# Patient Record
Sex: Male | Born: 1980 | Race: White | Hispanic: No | Marital: Single | Smoking: Current every day smoker
Health system: Southern US, Community
[De-identification: ages and names within clinical notes are randomized; demographics above are authoritative.]

---

## 2011-06-30 ENCOUNTER — Emergency Department (INDEPENDENT_AMBULATORY_CARE_PROVIDER_SITE_OTHER)
Admission: EM | Admit: 2011-06-30 | Discharge: 2011-06-30 | Disposition: A | Discharge status: Home / Self Care | Payer: 59 | Source: Home / Self Care | Attending: Family Medicine | Admitting: Family Medicine

## 2011-06-30 ENCOUNTER — Encounter: Payer: Self-pay | Admitting: *Deleted

## 2011-06-30 DIAGNOSIS — J069 Acute upper respiratory infection, unspecified: Secondary | ICD-10-CM

## 2011-06-30 DIAGNOSIS — H6592 Unspecified nonsuppurative otitis media, left ear: Secondary | ICD-10-CM

## 2011-06-30 DIAGNOSIS — H6590 Unspecified nonsuppurative otitis media, unspecified ear: Secondary | ICD-10-CM

## 2011-06-30 MED ORDER — BENZONATATE 200 MG PO CAPS: 200.0000 mg | ORAL_CAPSULE | Freq: Every day | ORAL | Status: AC

## 2011-06-30 MED ORDER — AZITHROMYCIN 250 MG PO TABS: ORAL_TABLET | ORAL | Status: AC

## 2011-06-30 NOTE — Discharge Instructions (Signed)
Take Mucinex D (guaifenesin with decongestant) twice daily for congestion.  Increase fluid intake, rest. May use Afrin nasal spray (or generic oxymetazoline) twice daily for about 5 days.  Also recommend using saline nasal spray several times daily and saline nasal irrigation (AYR is a common brand) Stop all antihistamines for now, and other non-prescription cough/cold preparations. Recommend a Tdap when well.  Follow-up with family doctor if not improving 7 to 10 days.  

## 2011-06-30 NOTE — ED Notes (Signed)
Pt c/o cough, possible fever, and HA x 3 days. He has taken nyquil and mucinex.

## 2011-06-30 NOTE — ED Provider Notes (Signed)
History     CSN: [id number]  Arrival date & time 06/30/11  1110   First MD Initiated Contact with Patient 06/30/11 1200      Chief Complaint  Patient presents with  . Fever  . Cough      HPI Comments: Patient complains of approximately 3 day history of gradually progressive URI symptoms beginning with fatigue and myalgias, followed by progressive nasal congestion.  There has been minimal sore throat.   A cough started yesterday.  Cough is now worse at night and generally non-productive during the day.  There has been no pleuritic pain, shortness of breath, or wheezes.   He smokes 1 pack per day.  He does not remember his last tetanus shot.  The history is provided by the patient.    History reviewed. No pertinent past medical history.  History reviewed. No pertinent past surgical history.  History reviewed. No pertinent family history.  History  Substance Use Topics  . Smoking status: Current Everyday Smoker -- 1.0 packs/day  . Smokeless tobacco: Not on file  . Alcohol Use: No      Review of Systems + mild sore throat with cough + cough No pleuritic pain No wheezing + nasal congestion + post-nasal drainage No sinus pain/pressure No itchy/red eyes ? Earache; ears feel somewhat clogged No hemoptysis No SOB No fever, + chills/sweats at night No nausea No vomiting No abdominal pain No diarrhea No urinary symptoms No skin rashes + fatigue + myalgias + headache Used OTC meds without relief (Nyquil) Allergies  Review of patient's allergies indicates no known allergies.  Home Medications   Current Outpatient Rx  Name Route Sig Dispense Refill  . AZITHROMYCIN 250 MG PO TABS  Take 2 tabs today; then begin one tab once daily for 4 more days. 6 each 0  . BENZONATATE 200 MG PO CAPS Oral Take 1 capsule (200 mg total) by mouth at bedtime. Take as needed for cough 12 capsule 0    BP 141/98  Pulse 73  Temp(Src) 98.8 F (37.1 C) (Oral)  Resp 16  Ht 6' (1.829 m)   Wt 200 lb 12 oz (91.06 kg)  BMI 27.23 kg/m2  SpO2 99%  Physical Exam Nursing notes and Vital Signs reviewed. Appearance:  Patient appears healthy, stated age, and in no acute distress Eyes:  Pupils are equal, round, and reactive to light and accomodation.  Extraocular movement is intact.  Conjunctivae are not inflamed  Ears:  Canals normal.  Right tympanic membrane appears to have some serous effusion.  Left tympanic membrane has serous effusion and developing mild erythema Nose:  Mildly congested turbinates, worse on the left.  No sinus tenderness.  Pharynx:  Normal Neck:  Supple.  No adenopathy Lungs:  Clear to auscultation.  Breath sounds are equal.  Heart:  Regular rate and rhythm without murmurs, rubs, or gallops.  Abdomen:  Nontender without masses or hepatosplenomegaly.  Bowel sounds are present.  No CVA or flank tenderness.  Extremities:  No edema.  No calf tenderness Skin:  No rash present.   ED Course  Procedures  none      1. Acute upper respiratory infections of unspecified site   2. Left serous otitis media       MDM  Begin a Z-pack.  Prescription written for Benzonatate [facility]) to take at bedtime for night-time cough.  Take Mucinex D (guaifenesin with decongestant) twice daily for congestion.  Increase fluid intake, rest. May use Afrin nasal spray (or generic oxymetazoline)  twice daily for about 5 days.  Also recommend using saline nasal spray several times daily and saline nasal irrigation (AYR is a common brand) Stop all antihistamines for now, and other non-prescription cough/cold preparations. Recommend a Tdap when well.  Follow-up with family doctor if not improving 7 to 10 days.         Lattie Haw, MD 06/30/11 1225

## 2013-08-16 ENCOUNTER — Emergency Department (HOSPITAL_COMMUNITY): Payer: 59

## 2013-08-16 ENCOUNTER — Inpatient Hospital Stay (HOSPITAL_COMMUNITY)
Admission: EM | Admit: 2013-08-16 | Discharge: 2013-08-20 | DRG: 917 | Disposition: A | Discharge status: Home / Self Care | Payer: 59 | Attending: Internal Medicine | Admitting: Internal Medicine

## 2013-08-16 ENCOUNTER — Encounter (HOSPITAL_COMMUNITY): Payer: Self-pay | Admitting: Emergency Medicine

## 2013-08-16 DIAGNOSIS — J9600 Acute respiratory failure, unspecified whether with hypoxia or hypercapnia: Secondary | ICD-10-CM | POA: Diagnosis present

## 2013-08-16 DIAGNOSIS — R911 Solitary pulmonary nodule: Secondary | ICD-10-CM | POA: Diagnosis present

## 2013-08-16 DIAGNOSIS — T43502A Poisoning by unspecified antipsychotics and neuroleptics, intentional self-harm, initial encounter: Secondary | ICD-10-CM | POA: Diagnosis present

## 2013-08-16 DIAGNOSIS — R9431 Abnormal electrocardiogram [ECG] [EKG]: Secondary | ICD-10-CM | POA: Diagnosis present

## 2013-08-16 DIAGNOSIS — E872 Acidosis, unspecified: Secondary | ICD-10-CM | POA: Diagnosis present

## 2013-08-16 DIAGNOSIS — F1910 Other psychoactive substance abuse, uncomplicated: Secondary | ICD-10-CM | POA: Diagnosis present

## 2013-08-16 DIAGNOSIS — I10 Essential (primary) hypertension: Secondary | ICD-10-CM | POA: Diagnosis present

## 2013-08-16 DIAGNOSIS — F1010 Alcohol abuse, uncomplicated: Secondary | ICD-10-CM | POA: Diagnosis present

## 2013-08-16 DIAGNOSIS — F17200 Nicotine dependence, unspecified, uncomplicated: Secondary | ICD-10-CM | POA: Diagnosis present

## 2013-08-16 DIAGNOSIS — F332 Major depressive disorder, recurrent severe without psychotic features: Secondary | ICD-10-CM | POA: Diagnosis present

## 2013-08-16 DIAGNOSIS — G9340 Encephalopathy, unspecified: Secondary | ICD-10-CM | POA: Diagnosis present

## 2013-08-16 DIAGNOSIS — T438X2A Poisoning by other psychotropic drugs, intentional self-harm, initial encounter: Secondary | ICD-10-CM | POA: Diagnosis present

## 2013-08-16 DIAGNOSIS — T424X4A Poisoning by benzodiazepines, undetermined, initial encounter: Principal | ICD-10-CM | POA: Diagnosis present

## 2013-08-16 DIAGNOSIS — J9601 Acute respiratory failure with hypoxia: Secondary | ICD-10-CM

## 2013-08-16 DIAGNOSIS — J9620 Acute and chronic respiratory failure, unspecified whether with hypoxia or hypercapnia: Secondary | ICD-10-CM | POA: Diagnosis present

## 2013-08-16 DIAGNOSIS — T424X2S Poisoning by benzodiazepines, intentional self-harm, sequela: Secondary | ICD-10-CM

## 2013-08-16 DIAGNOSIS — E876 Hypokalemia: Secondary | ICD-10-CM | POA: Diagnosis present

## 2013-08-16 DIAGNOSIS — F411 Generalized anxiety disorder: Secondary | ICD-10-CM | POA: Diagnosis present

## 2013-08-16 DIAGNOSIS — T424X1A Poisoning by benzodiazepines, accidental (unintentional), initial encounter: Secondary | ICD-10-CM

## 2013-08-16 DIAGNOSIS — T424X2A Poisoning by benzodiazepines, intentional self-harm, initial encounter: Secondary | ICD-10-CM

## 2013-08-16 DIAGNOSIS — J9621 Acute and chronic respiratory failure with hypoxia: Secondary | ICD-10-CM

## 2013-08-16 DIAGNOSIS — F333 Major depressive disorder, recurrent, severe with psychotic symptoms: Secondary | ICD-10-CM

## 2013-08-16 DIAGNOSIS — F10929 Alcohol use, unspecified with intoxication, unspecified: Secondary | ICD-10-CM

## 2013-08-16 DIAGNOSIS — T1491XA Suicide attempt, initial encounter: Secondary | ICD-10-CM

## 2013-08-16 DIAGNOSIS — IMO0001 Reserved for inherently not codable concepts without codable children: Secondary | ICD-10-CM

## 2013-08-16 LAB — CBC WITH DIFFERENTIAL/PLATELET
BASOS ABS: 0 10*3/uL (ref 0.0–0.1)
BASOS PCT: 0 % (ref 0–1)
EOS PCT: 1 % (ref 0–5)
Eosinophils Absolute: 0.1 10*3/uL (ref 0.0–0.7)
HEMATOCRIT: 47.5 % (ref 39.0–52.0)
HEMOGLOBIN: 16.9 g/dL (ref 13.0–17.0)
LYMPHS PCT: 18 % (ref 12–46)
Lymphs Abs: 1.4 10*3/uL (ref 0.7–4.0)
MCH: 32.2 pg (ref 26.0–34.0)
MCHC: 35.6 g/dL (ref 30.0–36.0)
MCV: 90.5 fL (ref 78.0–100.0)
MONO ABS: 0.6 10*3/uL (ref 0.1–1.0)
MONOS PCT: 8 % (ref 3–12)
NEUTROS ABS: 5.5 10*3/uL (ref 1.7–7.7)
Neutrophils Relative %: 73 % (ref 43–77)
Platelets: 180 10*3/uL (ref 150–400)
RBC: 5.25 MIL/uL (ref 4.22–5.81)
RDW: 12.2 % (ref 11.5–15.5)
WBC: 7.6 10*3/uL (ref 4.0–10.5)

## 2013-08-16 LAB — RAPID URINE DRUG SCREEN, HOSP PERFORMED
Amphetamines: NOT DETECTED
BARBITURATES: NOT DETECTED
Benzodiazepines: POSITIVE — AB
Cocaine: NOT DETECTED
Opiates: NOT DETECTED
TETRAHYDROCANNABINOL: NOT DETECTED

## 2013-08-16 LAB — URINALYSIS, ROUTINE W REFLEX MICROSCOPIC
BILIRUBIN URINE: NEGATIVE
Glucose, UA: NEGATIVE mg/dL
HGB URINE DIPSTICK: NEGATIVE
KETONES UR: NEGATIVE mg/dL
Leukocytes, UA: NEGATIVE
NITRITE: NEGATIVE
PROTEIN: NEGATIVE mg/dL
Specific Gravity, Urine: 1.008 (ref 1.005–1.030)
UROBILINOGEN UA: 0.2 mg/dL (ref 0.0–1.0)
pH: 6 (ref 5.0–8.0)

## 2013-08-16 LAB — COMPREHENSIVE METABOLIC PANEL
ALT: 17 U/L (ref 0–53)
ANION GAP: 17 — AB (ref 5–15)
AST: 16 U/L (ref 0–37)
Albumin: 3.8 g/dL (ref 3.5–5.2)
Alkaline Phosphatase: 59 U/L (ref 39–117)
BILIRUBIN TOTAL: 0.7 mg/dL (ref 0.3–1.2)
BUN: 9 mg/dL (ref 6–23)
CALCIUM: 9 mg/dL (ref 8.4–10.5)
CHLORIDE: 97 meq/L (ref 96–112)
CO2: 23 meq/L (ref 19–32)
CREATININE: 0.96 mg/dL (ref 0.50–1.35)
GFR calc Af Amer: >90 mL/min (ref >90)
Glucose, Bld: 88 mg/dL (ref 70–99)
Potassium: 3.2 mEq/L — ABNORMAL LOW (ref 3.7–5.3)
Sodium: 137 mEq/L (ref 137–147)
Total Protein: 6.9 g/dL (ref 6.0–8.3)

## 2013-08-16 LAB — BLOOD GAS, ARTERIAL
Acid-Base Excess: 1.5 mmol/L (ref 0.0–2.0)
Bicarbonate: 25.1 mEq/L — ABNORMAL HIGH (ref 20.0–24.0)
DRAWN BY: 308601
FIO2: 0.21 %
O2 Saturation: 97.6 %
PATIENT TEMPERATURE: 98.6
PCO2 ART: 38 mmHg (ref 35.0–45.0)
PH ART: 7.434 (ref 7.350–7.450)
TCO2: 20.8 mmol/L (ref 0–100)
pO2, Arterial: 95 mmHg (ref 80.0–100.0)

## 2013-08-16 LAB — ETHANOL: ALCOHOL ETHYL (B): 58 mg/dL — AB (ref 0–11)

## 2013-08-16 LAB — TROPONIN I

## 2013-08-16 MED ORDER — SODIUM CHLORIDE 0.9 % IV BOLUS (SEPSIS)
1000.0000 mL | Freq: Once | INTRAVENOUS | Status: AC
  Administered 2013-08-16: 1000 mL via INTRAVENOUS
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
## 2013-08-16 NOTE — ED Notes (Addendum)
Poison controlled contacted.  Spoke to [employer] Suggested: Seizure precautions Foley catheter to monitor output  Monitor for: Until return to baseline Prolonged QRS Urinary retention CNS/Respiratory depression Hypotension Bradycardia  Marissa Sciacca EDPA notified.
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
## 2013-08-17 ENCOUNTER — Encounter (HOSPITAL_COMMUNITY): Payer: Self-pay

## 2013-08-17 ENCOUNTER — Emergency Department (HOSPITAL_COMMUNITY): Payer: 59

## 2013-08-17 DIAGNOSIS — R0902 Hypoxemia: Secondary | ICD-10-CM

## 2013-08-17 DIAGNOSIS — F332 Major depressive disorder, recurrent severe without psychotic features: Secondary | ICD-10-CM

## 2013-08-17 DIAGNOSIS — J9620 Acute and chronic respiratory failure, unspecified whether with hypoxia or hypercapnia: Secondary | ICD-10-CM

## 2013-08-17 DIAGNOSIS — R45851 Suicidal ideations: Secondary | ICD-10-CM

## 2013-08-17 DIAGNOSIS — T424X1A Poisoning by benzodiazepines, accidental (unintentional), initial encounter: Secondary | ICD-10-CM

## 2013-08-17 LAB — BASIC METABOLIC PANEL
Anion gap: 11 (ref 5–15)
BUN: 8 mg/dL (ref 6–23)
CHLORIDE: 102 meq/L (ref 96–112)
CO2: 24 mEq/L (ref 19–32)
Calcium: 9.1 mg/dL (ref 8.4–10.5)
Creatinine, Ser: 0.95 mg/dL (ref 0.50–1.35)
GFR calc non Af Amer: >90 mL/min (ref >90)
Glucose, Bld: 90 mg/dL (ref 70–99)
POTASSIUM: 3.8 meq/L (ref 3.7–5.3)
Sodium: 137 mEq/L (ref 137–147)

## 2013-08-17 LAB — MAGNESIUM: Magnesium: 1.9 mg/dL (ref 1.5–2.5)

## 2013-08-17 LAB — CBC
HCT: 48.4 % (ref 39.0–52.0)
HEMOGLOBIN: 17.3 g/dL — AB (ref 13.0–17.0)
MCH: 32.1 pg (ref 26.0–34.0)
MCHC: 35.7 g/dL (ref 30.0–36.0)
MCV: 89.8 fL (ref 78.0–100.0)
Platelets: 218 10*3/uL (ref 150–400)
RBC: 5.39 MIL/uL (ref 4.22–5.81)
RDW: 12.4 % (ref 11.5–15.5)
WBC: 7.9 10*3/uL (ref 4.0–10.5)

## 2013-08-17 LAB — PHOSPHORUS: PHOSPHORUS: 2.4 mg/dL (ref 2.3–4.6)

## 2013-08-17 LAB — OSMOLALITY: OSMOLALITY: 297 mosm/kg (ref 275–300)

## 2013-08-17 LAB — ACETAMINOPHEN LEVEL

## 2013-08-17 LAB — SALICYLATE LEVEL

## 2013-08-17 LAB — MRSA PCR SCREENING: MRSA BY PCR: NEGATIVE

## 2013-08-17 MED ORDER — POTASSIUM CHLORIDE 10 MEQ/100ML IV SOLN
10.0000 meq | INTRAVENOUS | Status: AC
  Administered 2013-08-17 (×4): 10 meq via INTRAVENOUS
  Filled 2013-08-17: qty 100

## 2013-08-17 MED ORDER — IOHEXOL 300 MG/ML  SOLN
100.0000 mL | Freq: Once | INTRAMUSCULAR | Status: AC | PRN
  Administered 2013-08-17: 100 mL via INTRAVENOUS

## 2013-08-17 MED ORDER — SODIUM CHLORIDE 0.9 % IV SOLN
INTRAVENOUS | Status: DC
  Administered 2013-08-17: 100 mL/h via INTRAVENOUS
  Administered 2013-08-17: 03:00:00 via INTRAVENOUS
  Administered 2013-08-17: 100 mL/h via INTRAVENOUS
  Administered 2013-08-18 (×2): via INTRAVENOUS

## 2013-08-17 MED ORDER — BIOTENE DRY MOUTH MT LIQD
15.0000 mL | Freq: Two times a day (BID) | OROMUCOSAL | Status: DC
  Administered 2013-08-17 – 2013-08-20 (×7): 15 mL via OROMUCOSAL
  Filled 2013-08-17: qty 15

## 2013-08-17 MED ORDER — HEPARIN SODIUM (PORCINE) 5000 UNIT/ML IJ SOLN
5000.0000 [IU] | Freq: Three times a day (TID) | INTRAMUSCULAR | Status: DC
  Administered 2013-08-17: 5000 [IU] via SUBCUTANEOUS
  Filled 2013-08-17 (×13): qty 1

## 2013-08-17 MED ORDER — SODIUM CHLORIDE 0.9 % IV SOLN: 250.0000 mL | INTRAVENOUS | Status: DC | PRN

## 2013-08-17 NOTE — Progress Notes (Signed)
The patient refuses to eat.  He drank one coke cola but has refused other offers of beverages, snacks or meals.  Patient denies nausea.
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
## 2013-08-17 NOTE — Progress Notes (Signed)
Patient ripped off his identification band.  He stated " I will do what I want".
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
## 2013-08-18 DIAGNOSIS — F333 Major depressive disorder, recurrent, severe with psychotic symptoms: Secondary | ICD-10-CM

## 2013-08-18 DIAGNOSIS — J9600 Acute respiratory failure, unspecified whether with hypoxia or hypercapnia: Secondary | ICD-10-CM

## 2013-08-18 DIAGNOSIS — R911 Solitary pulmonary nodule: Secondary | ICD-10-CM

## 2013-08-18 DIAGNOSIS — F1910 Other psychoactive substance abuse, uncomplicated: Secondary | ICD-10-CM

## 2013-08-18 DIAGNOSIS — X838XXA Intentional self-harm by other specified means, initial encounter: Secondary | ICD-10-CM

## 2013-08-18 DIAGNOSIS — T50901S Poisoning by unspecified drugs, medicaments and biological substances, accidental (unintentional), sequela: Secondary | ICD-10-CM

## 2013-08-18 DIAGNOSIS — X838XXS Intentional self-harm by other specified means, sequela: Secondary | ICD-10-CM

## 2013-08-18 LAB — COMPREHENSIVE METABOLIC PANEL
ALBUMIN: 3.6 g/dL (ref 3.5–5.2)
ALK PHOS: 52 U/L (ref 39–117)
ALT: 13 U/L (ref 0–53)
AST: 12 U/L (ref 0–37)
Anion gap: 11 (ref 5–15)
BILIRUBIN TOTAL: 1.3 mg/dL — AB (ref 0.3–1.2)
BUN: 8 mg/dL (ref 6–23)
CHLORIDE: 105 meq/L (ref 96–112)
CO2: 25 mEq/L (ref 19–32)
CREATININE: 1.07 mg/dL (ref 0.50–1.35)
Calcium: 8.9 mg/dL (ref 8.4–10.5)
GFR calc Af Amer: >90 mL/min (ref >90)
GFR calc non Af Amer: >90 mL/min (ref >90)
Glucose, Bld: 85 mg/dL (ref 70–99)
POTASSIUM: 3.6 meq/L — AB (ref 3.7–5.3)
Sodium: 141 mEq/L (ref 137–147)
TOTAL PROTEIN: 6.1 g/dL (ref 6.0–8.3)

## 2013-08-18 MED ORDER — MAGNESIUM SULFATE 40 MG/ML IJ SOLN
2.0000 g | Freq: Once | INTRAMUSCULAR | Status: AC
  Administered 2013-08-18: 2 g via INTRAVENOUS
  Filled 2013-08-18: qty 50

## 2013-08-18 MED ORDER — POTASSIUM CHLORIDE CRYS ER 20 MEQ PO TBCR
40.0000 meq | EXTENDED_RELEASE_TABLET | ORAL | Status: AC
  Administered 2013-08-18: 40 meq via ORAL
  Filled 2013-08-18: qty 2

## 2013-08-18 NOTE — Progress Notes (Signed)
Report called to Beatrice LecherKim Cheek, RN.  Patient is alert and oriented.

## 2013-08-18 NOTE — Progress Notes (Signed)
[facility] FOR [facility] LAB REPLACEMENT ONLY  The patient does not apply for the [facility] Electrolyte Re[facility] Protocol based on the criteria listed below:   1. Is GFR >/= 40 ml/min? Yes.    Patient's GFR today is >90 2. Is urine output >/= 0.5 ml/kg/hr for the last 6 hours? No. Patient's UOP is    None recorded   ml/kg/hr 3. Is BUN < 60 mg/dL? Yes.    Patient's BUN today is 8 4. Abnormal electrolyte(s): K+3.6 5. Ordered repletion with: NA 6. If a panic level lab has been reported, has the CCM MD in charge been notified? Yes.  .   Physician:  E Deterding  Cathlean CowerBradshaw, Shawn Everett 08/18/2013 5:10 AM
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
## 2013-08-19 DIAGNOSIS — F1010 Alcohol abuse, uncomplicated: Secondary | ICD-10-CM

## 2013-08-19 LAB — BASIC METABOLIC PANEL
Anion gap: 9 (ref 5–15)
BUN: 7 mg/dL (ref 6–23)
CHLORIDE: 105 meq/L (ref 96–112)
CO2: 28 meq/L (ref 19–32)
Calcium: 9.2 mg/dL (ref 8.4–10.5)
Creatinine, Ser: 1.08 mg/dL (ref 0.50–1.35)
GFR calc Af Amer: >90 mL/min (ref >90)
GFR calc non Af Amer: 89 mL/min — ABNORMAL LOW (ref >90)
Glucose, Bld: 97 mg/dL (ref 70–99)
Potassium: 4.1 mEq/L (ref 3.7–5.3)
Sodium: 142 mEq/L (ref 137–147)

## 2013-08-19 LAB — MAGNESIUM: Magnesium: 2.2 mg/dL (ref 1.5–2.5)
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
## 2013-08-20 DIAGNOSIS — I10 Essential (primary) hypertension: Secondary | ICD-10-CM

## 2013-08-20 MED ORDER — IBUPROFEN 200 MG PO TABS: 200.0000 mg | ORAL_TABLET | Freq: Two times a day (BID) | ORAL | Status: AC | PRN
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
## 2013-08-20 NOTE — Discharge Instructions (Signed)
*   Arrange follow up with PCP in 2 weeks

## 2013-08-20 NOTE — Progress Notes (Signed)
Clinical Social Work  Psych MD agreeable for patient to follow up on Intensive Outpatient basis. CSW [facility] at 8:45 am. CSW placed information on AVS and informed patient, MD, and RN of DC plans.  CSW is signing off but available if further needs arise.  [place]olly Joanmarie Tsang, [place] [phone]
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
## 2013-09-27 ENCOUNTER — Telehealth (HOSPITAL_COMMUNITY): Payer: Self-pay

## 2013-09-28 ENCOUNTER — Ambulatory Visit (HOSPITAL_COMMUNITY): Payer: Self-pay | Admitting: Psychiatry
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
## 2019-10-03 DEATH — deceased
